# Patient Record
Sex: Male | Born: 1985 | Race: Black or African American | Hispanic: No | Marital: Single | Smoking: Never smoker
Health system: Southern US, Community
[De-identification: ages and names within clinical notes are randomized; demographics above are authoritative.]

## PROBLEM LIST (undated history)

## (undated) HISTORY — PX: BACK SURGERY: SHX140

---

## 2003-06-10 ENCOUNTER — Emergency Department (HOSPITAL_COMMUNITY): Admission: EM | Admit: 2003-06-10 | Discharge: 2003-06-10 | Payer: Self-pay | Admitting: Emergency Medicine

## 2003-08-21 ENCOUNTER — Encounter: Admission: RE | Admit: 2003-08-21 | Discharge: 2003-08-21 | Payer: Self-pay | Admitting: Internal Medicine

## 2003-12-17 ENCOUNTER — Emergency Department (HOSPITAL_COMMUNITY): Admission: EM | Admit: 2003-12-17 | Discharge: 2003-12-17 | Payer: Self-pay | Admitting: Emergency Medicine

## 2003-12-20 ENCOUNTER — Emergency Department (HOSPITAL_COMMUNITY): Admission: EM | Admit: 2003-12-20 | Discharge: 2003-12-20 | Payer: Self-pay | Admitting: Emergency Medicine

## 2004-05-09 ENCOUNTER — Encounter: Admission: RE | Admit: 2004-05-09 | Discharge: 2004-05-09 | Payer: Self-pay | Admitting: Emergency Medicine

## 2005-08-27 ENCOUNTER — Emergency Department (HOSPITAL_COMMUNITY): Admission: EM | Admit: 2005-08-27 | Discharge: 2005-08-27 | Payer: Self-pay | Admitting: Emergency Medicine

## 2007-04-04 ENCOUNTER — Encounter: Admission: RE | Admit: 2007-04-04 | Discharge: 2007-04-04 | Payer: Self-pay | Admitting: Family Medicine

## 2007-06-01 ENCOUNTER — Ambulatory Visit (HOSPITAL_COMMUNITY)
Admission: RE | Admit: 2007-06-01 | Discharge: 2007-06-01 | Payer: Self-pay | Admitting: Certified Registered Nurse Anesthetist

## 2019-02-01 ENCOUNTER — Encounter (HOSPITAL_COMMUNITY): Payer: Self-pay

## 2019-02-01 ENCOUNTER — Emergency Department (HOSPITAL_COMMUNITY)
Admission: EM | Admit: 2019-02-01 | Discharge: 2019-02-01 | Disposition: A | Discharge status: Home / Self Care | Payer: BLUE CROSS/BLUE SHIELD | Attending: Emergency Medicine | Admitting: Emergency Medicine

## 2019-02-01 ENCOUNTER — Other Ambulatory Visit: Payer: Self-pay

## 2019-02-01 ENCOUNTER — Emergency Department (HOSPITAL_COMMUNITY): Payer: BLUE CROSS/BLUE SHIELD

## 2019-02-01 DIAGNOSIS — R519 Headache, unspecified: Secondary | ICD-10-CM | POA: Insufficient documentation

## 2019-02-01 DIAGNOSIS — Y999 Unspecified external cause status: Secondary | ICD-10-CM | POA: Insufficient documentation

## 2019-02-01 DIAGNOSIS — M542 Cervicalgia: Secondary | ICD-10-CM | POA: Diagnosis present

## 2019-02-01 DIAGNOSIS — R109 Unspecified abdominal pain: Secondary | ICD-10-CM | POA: Diagnosis not present

## 2019-02-01 DIAGNOSIS — T07XXXA Unspecified multiple injuries, initial encounter: Secondary | ICD-10-CM

## 2019-02-01 DIAGNOSIS — S3991XA Unspecified injury of abdomen, initial encounter: Secondary | ICD-10-CM | POA: Insufficient documentation

## 2019-02-01 DIAGNOSIS — Y92410 Unspecified street and highway as the place of occurrence of the external cause: Secondary | ICD-10-CM | POA: Insufficient documentation

## 2019-02-01 DIAGNOSIS — Y9389 Activity, other specified: Secondary | ICD-10-CM | POA: Insufficient documentation

## 2019-02-01 LAB — BASIC METABOLIC PANEL
Anion gap: 10 (ref 5–15)
BUN: 11 mg/dL (ref 6–20)
CO2: 26 mmol/L (ref 22–32)
Calcium: 9.4 mg/dL (ref 8.9–10.3)
Chloride: 103 mmol/L (ref 98–111)
Creatinine, Ser: 1.11 mg/dL (ref 0.61–1.24)
GFR calc Af Amer: >60 mL/min (ref >60)
GFR calc non Af Amer: >60 mL/min (ref >60)
Glucose, Bld: 78 mg/dL (ref 70–99)
Potassium: 3.8 mmol/L (ref 3.5–5.1)
Sodium: 139 mmol/L (ref 135–145)

## 2019-02-01 LAB — CBC WITH DIFFERENTIAL/PLATELET
Abs Immature Granulocytes: 0 10*3/uL (ref 0.00–0.07)
Basophils Absolute: 0 10*3/uL (ref 0.0–0.1)
Basophils Relative: 1 %
Eosinophils Absolute: 0 10*3/uL (ref 0.0–0.5)
Eosinophils Relative: 1 %
HCT: 39.3 % (ref 39.0–52.0)
Hemoglobin: 13.3 g/dL (ref 13.0–17.0)
Immature Granulocytes: 0 %
Lymphocytes Relative: 44 %
Lymphs Abs: 1.8 10*3/uL (ref 0.7–4.0)
MCH: 29.5 pg (ref 26.0–34.0)
MCHC: 33.8 g/dL (ref 30.0–36.0)
MCV: 87.1 fL (ref 80.0–100.0)
Monocytes Absolute: 0.4 10*3/uL (ref 0.1–1.0)
Monocytes Relative: 9 %
Neutro Abs: 1.9 10*3/uL (ref 1.7–7.7)
Neutrophils Relative %: 45 %
Platelets: 178 10*3/uL (ref 150–400)
RBC: 4.51 MIL/uL (ref 4.22–5.81)
RDW: 12.7 % (ref 11.5–15.5)
WBC: 4.2 10*3/uL (ref 4.0–10.5)
nRBC: 0 % (ref 0.0–0.2)

## 2019-02-01 MED ORDER — LACTATED RINGERS IV BOLUS
1000.0000 mL | Freq: Once | INTRAVENOUS | Status: AC
  Administered 2019-02-01: 09:00:00 1000 mL via INTRAVENOUS

## 2019-02-01 MED ORDER — MORPHINE SULFATE (PF) 4 MG/ML IV SOLN
6.0000 mg | Freq: Once | INTRAVENOUS | Status: AC
  Administered 2019-02-01: 6 mg via INTRAVENOUS
  Filled 2019-02-01: qty 2

## 2019-02-01 MED ORDER — SODIUM CHLORIDE (PF) 0.9 % IJ SOLN
INTRAMUSCULAR | Status: AC
  Filled 2019-02-01: qty 50

## 2019-02-01 MED ORDER — IOHEXOL 300 MG/ML  SOLN
100.0000 mL | Freq: Once | INTRAMUSCULAR | Status: AC | PRN
  Administered 2019-02-01: 100 mL via INTRAVENOUS

## 2019-02-01 MED ORDER — KETOROLAC TROMETHAMINE 15 MG/ML IJ SOLN
15.0000 mg | Freq: Once | INTRAMUSCULAR | Status: AC
  Administered 2019-02-01: 15 mg via INTRAVENOUS
  Filled 2019-02-01: qty 1
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
## 2019-02-01 NOTE — Discharge Instructions (Addendum)
Take 600 mg of ibuprofen every 6 hours as needed for pain
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
## 2019-02-09 ENCOUNTER — Emergency Department (HOSPITAL_COMMUNITY): Payer: BLUE CROSS/BLUE SHIELD

## 2019-02-09 ENCOUNTER — Other Ambulatory Visit: Payer: Self-pay

## 2019-02-09 ENCOUNTER — Emergency Department (HOSPITAL_COMMUNITY)
Admission: EM | Admit: 2019-02-09 | Discharge: 2019-02-09 | Disposition: A | Discharge status: Home / Self Care | Payer: BLUE CROSS/BLUE SHIELD | Attending: Emergency Medicine | Admitting: Emergency Medicine

## 2019-02-09 ENCOUNTER — Encounter (HOSPITAL_COMMUNITY): Payer: Self-pay

## 2019-02-09 DIAGNOSIS — M542 Cervicalgia: Secondary | ICD-10-CM | POA: Diagnosis not present

## 2019-02-09 DIAGNOSIS — Z79899 Other long term (current) drug therapy: Secondary | ICD-10-CM | POA: Diagnosis not present

## 2019-02-09 DIAGNOSIS — M549 Dorsalgia, unspecified: Secondary | ICD-10-CM | POA: Insufficient documentation

## 2019-02-09 DIAGNOSIS — R1012 Left upper quadrant pain: Secondary | ICD-10-CM | POA: Insufficient documentation

## 2019-02-09 DIAGNOSIS — R0789 Other chest pain: Secondary | ICD-10-CM | POA: Insufficient documentation

## 2019-02-09 DIAGNOSIS — M7918 Myalgia, other site: Secondary | ICD-10-CM

## 2019-02-09 DIAGNOSIS — R4182 Altered mental status, unspecified: Secondary | ICD-10-CM | POA: Diagnosis present

## 2019-02-09 LAB — COMPREHENSIVE METABOLIC PANEL
ALT: 46 U/L — ABNORMAL HIGH (ref 0–44)
AST: 30 U/L (ref 15–41)
Albumin: 4.3 g/dL (ref 3.5–5.0)
Alkaline Phosphatase: 82 U/L (ref 38–126)
Anion gap: 11 (ref 5–15)
BUN: 17 mg/dL (ref 6–20)
CO2: 24 mmol/L (ref 22–32)
Calcium: 9.3 mg/dL (ref 8.9–10.3)
Chloride: 107 mmol/L (ref 98–111)
Creatinine, Ser: 1.2 mg/dL (ref 0.61–1.24)
GFR calc Af Amer: >60 mL/min (ref >60)
GFR calc non Af Amer: >60 mL/min (ref >60)
Glucose, Bld: 104 mg/dL — ABNORMAL HIGH (ref 70–99)
Potassium: 4.3 mmol/L (ref 3.5–5.1)
Sodium: 142 mmol/L (ref 135–145)
Total Bilirubin: 0.7 mg/dL (ref 0.3–1.2)
Total Protein: 7.2 g/dL (ref 6.5–8.1)

## 2019-02-09 LAB — I-STAT CHEM 8, ED
BUN: 17 mg/dL (ref 6–20)
Calcium, Ion: 1.24 mmol/L (ref 1.15–1.40)
Chloride: 109 mmol/L (ref 98–111)
Creatinine, Ser: 1.2 mg/dL (ref 0.61–1.24)
Glucose, Bld: 96 mg/dL (ref 70–99)
HCT: 37 % — ABNORMAL LOW (ref 39.0–52.0)
Hemoglobin: 12.6 g/dL — ABNORMAL LOW (ref 13.0–17.0)
Potassium: 4.3 mmol/L (ref 3.5–5.1)
Sodium: 143 mmol/L (ref 135–145)
TCO2: 24 mmol/L (ref 22–32)

## 2019-02-09 LAB — CBC WITH DIFFERENTIAL/PLATELET
Abs Immature Granulocytes: 0.01 10*3/uL (ref 0.00–0.07)
Basophils Absolute: 0 10*3/uL (ref 0.0–0.1)
Basophils Relative: 1 %
Eosinophils Absolute: 0 10*3/uL (ref 0.0–0.5)
Eosinophils Relative: 1 %
HCT: 37.6 % — ABNORMAL LOW (ref 39.0–52.0)
Hemoglobin: 12.5 g/dL — ABNORMAL LOW (ref 13.0–17.0)
Immature Granulocytes: 0 %
Lymphocytes Relative: 40 %
Lymphs Abs: 1.5 10*3/uL (ref 0.7–4.0)
MCH: 28.9 pg (ref 26.0–34.0)
MCHC: 33.2 g/dL (ref 30.0–36.0)
MCV: 86.8 fL (ref 80.0–100.0)
Monocytes Absolute: 0.2 10*3/uL (ref 0.1–1.0)
Monocytes Relative: 5 %
Neutro Abs: 2 10*3/uL (ref 1.7–7.7)
Neutrophils Relative %: 53 %
Platelets: 202 10*3/uL (ref 150–400)
RBC: 4.33 MIL/uL (ref 4.22–5.81)
RDW: 12.7 % (ref 11.5–15.5)
WBC: 3.7 10*3/uL — ABNORMAL LOW (ref 4.0–10.5)
nRBC: 0 % (ref 0.0–0.2)

## 2019-02-09 LAB — ETHANOL: Alcohol, Ethyl (B): <10 mg/dL (ref <10)

## 2019-02-09 LAB — LIPASE, BLOOD: Lipase: 33 U/L (ref 11–51)

## 2019-02-09 MED ORDER — IOHEXOL 300 MG/ML  SOLN
100.0000 mL | Freq: Once | INTRAMUSCULAR | Status: AC | PRN
  Administered 2019-02-09: 100 mL via INTRATHECAL

## 2019-02-09 MED ORDER — METHOCARBAMOL 750 MG PO TABS: 750.0000 mg | ORAL_TABLET | Freq: Three times a day (TID) | ORAL | 0 refills | Status: AC | PRN

## 2019-02-09 MED ORDER — KETOROLAC TROMETHAMINE 30 MG/ML IJ SOLN
15.0000 mg | Freq: Once | INTRAMUSCULAR | Status: AC
  Administered 2019-02-09: 15 mg via INTRAVENOUS
  Filled 2019-02-09: qty 1

## 2019-02-09 MED ORDER — SODIUM CHLORIDE (PF) 0.9 % IJ SOLN
INTRAMUSCULAR | Status: AC
  Filled 2019-02-09: qty 50
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
## 2019-02-09 NOTE — ED Notes (Signed)
Discharge instructions reviewed with patient. Patient verbalizes understanding. VSS.   

## 2019-02-09 NOTE — ED Notes (Signed)
Pt removed his C-collar himself when asked to remove his necklace for his CT scan. This tech replaced C-collar on pt for scan.

## 2019-08-04 ENCOUNTER — Emergency Department (HOSPITAL_COMMUNITY)
Admission: EM | Admit: 2019-08-04 | Discharge: 2019-08-04 | Disposition: A | Discharge status: Home / Self Care | Payer: BLUE CROSS/BLUE SHIELD | Attending: Emergency Medicine | Admitting: Emergency Medicine

## 2019-08-04 ENCOUNTER — Other Ambulatory Visit: Payer: Self-pay

## 2019-08-04 ENCOUNTER — Encounter (HOSPITAL_COMMUNITY): Payer: Self-pay

## 2019-08-04 ENCOUNTER — Emergency Department (HOSPITAL_COMMUNITY): Payer: BLUE CROSS/BLUE SHIELD

## 2019-08-04 DIAGNOSIS — R55 Syncope and collapse: Secondary | ICD-10-CM | POA: Insufficient documentation

## 2019-08-04 DIAGNOSIS — Y999 Unspecified external cause status: Secondary | ICD-10-CM | POA: Insufficient documentation

## 2019-08-04 DIAGNOSIS — W010XXA Fall on same level from slipping, tripping and stumbling without subsequent striking against object, initial encounter: Secondary | ICD-10-CM | POA: Insufficient documentation

## 2019-08-04 DIAGNOSIS — S93401A Sprain of unspecified ligament of right ankle, initial encounter: Secondary | ICD-10-CM

## 2019-08-04 DIAGNOSIS — Z79899 Other long term (current) drug therapy: Secondary | ICD-10-CM | POA: Diagnosis not present

## 2019-08-04 DIAGNOSIS — Y929 Unspecified place or not applicable: Secondary | ICD-10-CM | POA: Diagnosis not present

## 2019-08-04 DIAGNOSIS — Y9389 Activity, other specified: Secondary | ICD-10-CM | POA: Insufficient documentation

## 2019-08-04 DIAGNOSIS — S8391XA Sprain of unspecified site of right knee, initial encounter: Secondary | ICD-10-CM | POA: Insufficient documentation

## 2019-08-04 LAB — BASIC METABOLIC PANEL
Anion gap: 10 (ref 5–15)
BUN: 11 mg/dL (ref 6–20)
CO2: 24 mmol/L (ref 22–32)
Calcium: 9 mg/dL (ref 8.9–10.3)
Chloride: 108 mmol/L (ref 98–111)
Creatinine, Ser: 1.12 mg/dL (ref 0.61–1.24)
GFR calc Af Amer: >60 mL/min (ref >60)
GFR calc non Af Amer: >60 mL/min (ref >60)
Glucose, Bld: 85 mg/dL (ref 70–99)
Potassium: 3.9 mmol/L (ref 3.5–5.1)
Sodium: 142 mmol/L (ref 135–145)

## 2019-08-04 LAB — CBC
HCT: 38.7 % — ABNORMAL LOW (ref 39.0–52.0)
Hemoglobin: 12.3 g/dL — ABNORMAL LOW (ref 13.0–17.0)
MCH: 26.2 pg (ref 26.0–34.0)
MCHC: 31.8 g/dL (ref 30.0–36.0)
MCV: 82.5 fL (ref 80.0–100.0)
Platelets: 200 10*3/uL (ref 150–400)
RBC: 4.69 MIL/uL (ref 4.22–5.81)
RDW: 14.1 % (ref 11.5–15.5)
WBC: 5 10*3/uL (ref 4.0–10.5)
nRBC: 0 % (ref 0.0–0.2)

## 2019-08-04 MED ORDER — IBUPROFEN 800 MG PO TABS
800.0000 mg | ORAL_TABLET | Freq: Once | ORAL | Status: AC
  Administered 2019-08-04: 800 mg via ORAL
  Filled 2019-08-04: qty 1

## 2019-08-04 NOTE — Progress Notes (Signed)
Orthopedic Tech Progress Note Patient Details:  Omar Alexander [birth year] [id number]  Ortho Devices Type of Ortho Device: Ankle Air splint, Knee Immobilizer, Crutches Ortho Device/Splint Location: right Ortho Device/Splint Interventions: Application   Post Interventions Patient Tolerated: Well Instructions Provided: Care of device   Saul Fordyce 08/04/2019, 6:21 PM

## 2019-08-04 NOTE — ED Triage Notes (Signed)
Pt reports syncopal episode today while playing with his kids, pt rolled his right ankle during the episode but did not hit his head. Pt c.o pain in his right leg from the knee down to his foot. Pt a.o at this time.

## 2019-08-04 NOTE — ED Notes (Signed)
Patient Alert and oriented to baseline. Stable and ambulatory to baseline. Patient verbalized understanding of the discharge instructions.  Patient belongings were taken by the patient.   
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
## 2019-10-06 ENCOUNTER — Emergency Department (HOSPITAL_COMMUNITY)
Admission: EM | Admit: 2019-10-06 | Discharge: 2019-10-06 | Disposition: A | Discharge status: Home / Self Care | Payer: BLUE CROSS/BLUE SHIELD | Attending: Emergency Medicine | Admitting: Emergency Medicine

## 2019-10-06 ENCOUNTER — Emergency Department (HOSPITAL_COMMUNITY): Payer: BLUE CROSS/BLUE SHIELD

## 2019-10-06 ENCOUNTER — Encounter (HOSPITAL_COMMUNITY): Payer: Self-pay

## 2019-10-06 DIAGNOSIS — Z23 Encounter for immunization: Secondary | ICD-10-CM | POA: Diagnosis not present

## 2019-10-06 DIAGNOSIS — Y9389 Activity, other specified: Secondary | ICD-10-CM | POA: Diagnosis not present

## 2019-10-06 DIAGNOSIS — Y92810 Car as the place of occurrence of the external cause: Secondary | ICD-10-CM | POA: Insufficient documentation

## 2019-10-06 DIAGNOSIS — S61220A Laceration with foreign body of right index finger without damage to nail, initial encounter: Secondary | ICD-10-CM

## 2019-10-06 DIAGNOSIS — W231XXA Caught, crushed, jammed, or pinched between stationary objects, initial encounter: Secondary | ICD-10-CM | POA: Diagnosis not present

## 2019-10-06 DIAGNOSIS — Y999 Unspecified external cause status: Secondary | ICD-10-CM | POA: Insufficient documentation

## 2019-10-06 MED ORDER — TETANUS-DIPHTH-ACELL PERTUSSIS 5-2.5-18.5 LF-MCG/0.5 IM SUSP
0.5000 mL | Freq: Once | INTRAMUSCULAR | Status: AC
  Administered 2019-10-06: 0.5 mL via INTRAMUSCULAR
  Filled 2019-10-06: qty 0.5

## 2019-10-06 MED ORDER — LIDOCAINE HCL (PF) 1 % IJ SOLN
30.0000 mL | Freq: Once | INTRAMUSCULAR | Status: AC
  Administered 2019-10-06: 30 mL
  Filled 2019-10-06: qty 30

## 2019-10-06 MED ORDER — CEPHALEXIN 500 MG PO CAPS: 500.0000 mg | ORAL_CAPSULE | Freq: Three times a day (TID) | ORAL | 0 refills | Status: AC

## 2019-10-06 NOTE — ED Triage Notes (Signed)
Pt arrives to ED w/ c/o R index finger injury. Pt states he was fixing ball joint on his car when his finger got smashed. Pt c/o 10/10 pain.

## 2019-10-06 NOTE — Discharge Instructions (Addendum)
You were evaluated in the Emergency Department and after careful evaluation, we did not find any emergent condition requiring admission or further testing in the hospital.  Your exam/testing today was overall reassuring.  We washed and repaired your laceration here in the emergency department.  Your x-ray did not show any broken bones.  Please take the antibiotics as directed.  Recommend he follow-up with a hand specialist for a repeat exam.  Please return to the Emergency Department if you experience any worsening of your condition.  We encourage you to follow up with a primary care provider.  Thank you for allowing [place] to be a part of your care.

## 2019-10-06 NOTE — ED Provider Notes (Signed)
[facility]-EMERGENCY DEPT [facility] Emergency Department Provider Note MRN:  [id number]  Arrival date & time: 10/06/19     Chief Complaint   Finger Injury   History of Present Illness   Omar Alexander is a 34 y.o. year-old male with no pertinent past medical history presenting to the ED with chief complaint of finger injury.  Patient was fixing something over his car when the ball joint slipped and fell onto his right pointer finger.  Pulled away rapidly, causing a laceration.  Endorsing mild to moderate pain to the right finger, constant, worse with motion or palpation.  Denies any other injuries, no fall, no head trauma.  Patient is right-handed, retired Hotel manager.  Review of Systems  A complete 10 system review of systems was obtained and all systems are negative except as noted in the HPI and PMH.   Patient's Health History   History reviewed. No pertinent past medical history.  Past Surgical History:  Procedure Laterality Date  . BACK SURGERY     lumbar surgeries x4    No family history on file.  Social History   Socioeconomic History  . Marital status: Single    Spouse name: Not on file  . Number of children: Not on file  . Years of education: Not on file  . Highest education level: Not on file  Occupational History  . Not on file  Tobacco Use  . Smoking status: Never Smoker  . Smokeless tobacco: Never Used  Substance and Sexual Activity  . Alcohol use: Yes    Comment: occ  . Drug use: Never  . Sexual activity: Not on file  Other Topics Concern  . Not on file  Social History Narrative  . Not on file   Social Determinants of Health   Financial Resource Strain:   . Difficulty of Paying Living Expenses:   Food Insecurity:   . Worried About Programme researcher, broadcasting/film/video in the Last Year:   . Barista in the Last Year:   Transportation Needs:   . Freight forwarder (Medical):   Marland Kitchen Lack of Transportation (Non-Medical):   Physical Activity:   . Days of  Exercise per Week:   . Minutes of Exercise per Session:   Stress:   . Feeling of Stress :   Social Connections:   . Frequency of Communication with Friends and Family:   . Frequency of Social Gatherings with Friends and Family:   . Attends Religious Services:   . Active Member of Clubs or Organizations:   . Attends Banker Meetings:   Marland Kitchen Marital Status:   Intimate Partner Violence:   . Fear of Current or Ex-Partner:   . Emotionally Abused:   Marland Kitchen Physically Abused:   . Sexually Abused:      Physical Exam   Vitals:   10/06/19 1424 10/06/19 1746  BP: 119/89 125/81  Pulse: 96 98  Resp: 16 16  Temp: 98.9 F (37.2 C) 98.7 F (37.1 C)  SpO2: 100% 99%    CONSTITUTIONAL: Well-appearing, NAD NEURO:  Alert and oriented x 3, no focal deficits EYES:  eyes equal and reactive ENT/NECK:  no LAD, no JVD CARDIO: Regular rate, well-perfused, normal S1 and S2 PULM:  CTAB no wheezing or rhonchi GI/GU:  normal bowel sounds, non-distended, non-tender MSK/SPINE:  No gross deformities, no edema SKIN: Distal pad of the right lower ring finger has a curved flap laceration PSYCH:  Appropriate speech and behavior  *Additional and/or pertinent findings included  in MDM below  Diagnostic and Interventional Summary    EKG Interpretation  Date/Time:    Ventricular Rate:    PR Interval:    QRS Duration:   QT Interval:    QTC Calculation:   R Axis:     Text Interpretation:        Labs Reviewed - No data to display  DG Finger Index Right  Final Result      Medications  lidocaine (PF) (XYLOCAINE) 1 % injection 30 mL (30 mLs Other Given 10/06/19 1643)  Tdap (BOOSTRIX) injection 0.5 mL (0.5 mLs Intramuscular Given 10/06/19 1643)     Procedures  /  Critical Care .Marland KitchenLaceration Repair  Date/Time: 10/06/2019 5:43 PM Performed by: Sabas Sous, MD Authorized by: Sabas Sous, MD   Consent:    Consent obtained:  Verbal   Consent given by:  Patient   Risks discussed:   Infection, need for additional repair, pain, nerve damage, poor wound healing, poor cosmetic result, vascular damage, tendon damage and retained foreign body Anesthesia (see MAR for exact dosages):    Anesthesia method:  Nerve block   Block location:  Right index finger   Block needle gauge:  27 G   Block anesthetic:  Lidocaine 1% w/o epi   Block technique:  Circumferential injection   Block injection procedure:  Anatomic landmarks identified, anatomic landmarks palpated and negative aspiration for blood   Block outcome:  Anesthesia achieved Laceration details:    Location:  Finger   Finger location:  R index finger   Length (cm):  2   Depth (mm):  2 Repair type:    Repair type:  Complex Pre-procedure details:    Preparation:  Patient was prepped and draped in usual sterile fashion Exploration:    Limited defect created (wound extended): yes     Hemostasis achieved with:  Tied off vessels   Wound exploration: wound explored through full range of motion and entire depth of wound probed and visualized     Contaminated: yes   Treatment:    Area cleansed with:  Saline   Amount of cleaning:  Standard   Debridement:  Minimal   Undermining:  Extensive Subcutaneous repair:    Suture size:  5-0   Wound subcutaneous closure material used: Rapid Vicryl.   Suture technique:  Figure eight   Number of sutures:  1 Skin repair:    Repair method:  Sutures   Suture size:  5-0   Suture material:  Prolene   Suture technique:  Simple interrupted   Number of sutures:  8 Approximation:    Approximation:  Close Post-procedure details:    Dressing:  Sterile dressing   Patient tolerance of procedure:  Tolerated well, no immediate complications Comments:     Complex flap laceration with underlying small arterial bleed, hemostasis achieved with a single figure-of-eight stitch.  The flap and underlying exposed tissue was thoroughly washed and scrubbed to ensure that the foreign body commented on  x-ray would be washed away.    ED Course and Medical Decision Making  I have reviewed the triage vital signs, the nursing notes, and pertinent available records from the EMR.  Listed above are laboratory and imaging tests that I personally ordered, reviewed, and interpreted and then considered in my medical decision making (see below for details).      X-ray is without fracture, there is no evidence of foreign body within the wound.  Will perform digital nerve block, washed thoroughly, repair.  Laceration  repaired as described above.  Patient does have some decreased sensation to the tip of this finger distal to the flap laceration.  Advised hand surgery follow-up for repeat exam and other management solutions.  Barth Kirks. Sedonia Small, MD [place] [email or web address]  Final Clinical Impressions(s) / ED Diagnoses     ICD-10-CM   1. Laceration of right index finger with foreign body without damage to nail, initial encounter  S61.220A     ED Discharge Orders         Ordered    cephALEXin (KEFLEX) 500 MG capsule  3 times daily     Discontinue  Reprint     10/06/19 1740           Discharge Instructions Discussed with and Provided to Patient:     Discharge Instructions     You were evaluated in the Emergency Department and after careful evaluation, we did not find any emergent condition requiring admission or further testing in the hospital.  Your exam/testing today was overall reassuring.  We washed and repaired your laceration here in the emergency department.  Your x-ray did not show any broken bones.  Please take the antibiotics as directed.  Recommend he follow-up with a hand specialist for a repeat exam.  Please return to the Emergency Department if you experience any worsening of your condition.  We encourage you to follow up with a primary care provider.  Thank you for allowing [place] to be a part of your care.        Maudie Flakes, MD 10/06/19 1747
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
## 2022-01-07 IMAGING — CR DG FINGER INDEX 2+V*R*
3 series · 3 of 3 positions shown · non-contrast
Comparison: None.

CLINICAL DATA: Crush injury to the second digit, initial encounter

EXAM:
RIGHT INDEX FINGER 2+V

[finger ap]
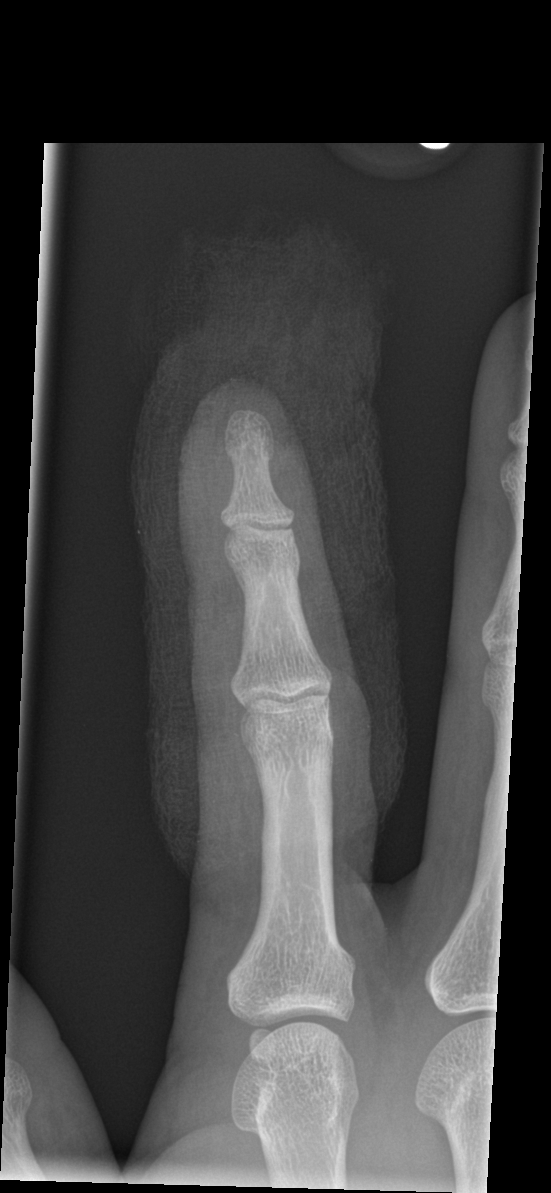

[finger obl]
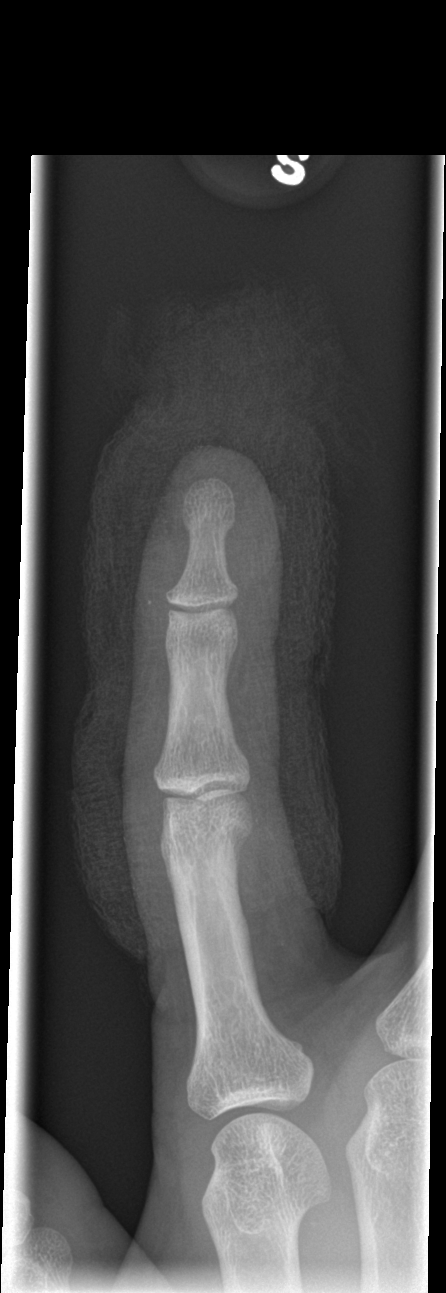

[finger lat]
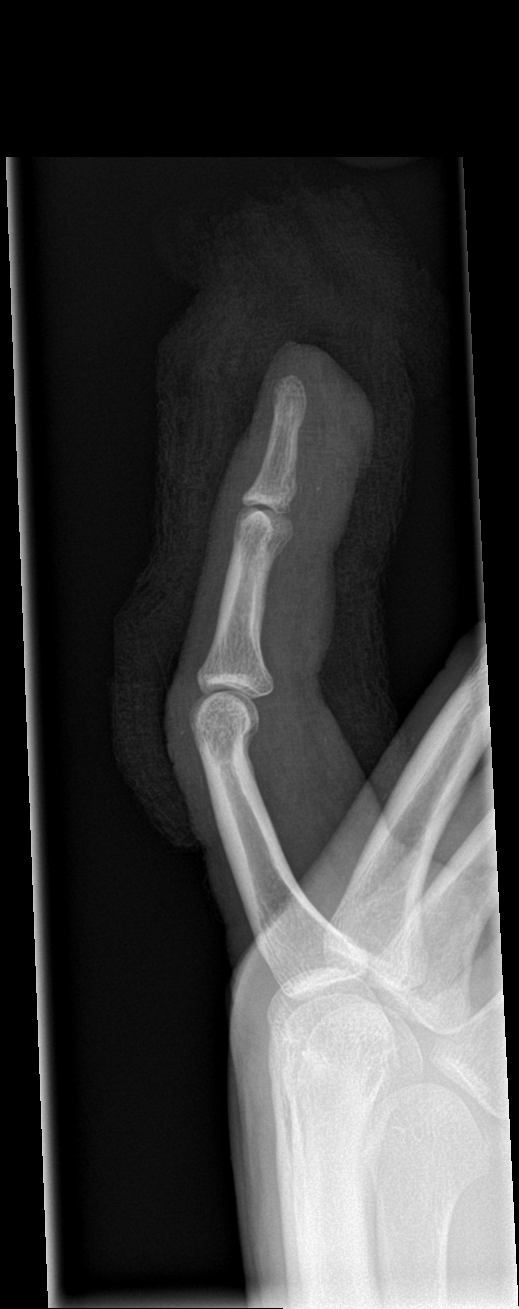

[3 of 3 positions shown; findings below may reference images not displayed]

FINDINGS: No acute fracture or dislocation is noted. A radiopaque density is
noted which lies within the overlying dressing and does not appear
to be within the finger. Soft tissue injury is noted distally
consistent with the given clinical history.
IMPRESSION: Soft tissue injury without acute bony abnormality. Radiopaque
density is noted which lies within the overlying dressing.

## 2022-06-09 ENCOUNTER — Other Ambulatory Visit (HOSPITAL_COMMUNITY): Payer: Self-pay
